# Patient Record
Sex: Male | Born: 1935 | Race: Black or African American | Hispanic: No | Marital: Married | Smoking: Never smoker
Health system: Southern US, Community
[De-identification: ages and names within clinical notes are randomized; demographics above are authoritative.]

## PROBLEM LIST (undated history)

## (undated) DIAGNOSIS — W3400XA Accidental discharge from unspecified firearms or gun, initial encounter: Secondary | ICD-10-CM

## (undated) DIAGNOSIS — M1990 Unspecified osteoarthritis, unspecified site: Secondary | ICD-10-CM

## (undated) DIAGNOSIS — I2510 Atherosclerotic heart disease of native coronary artery without angina pectoris: Secondary | ICD-10-CM

## (undated) DIAGNOSIS — I639 Cerebral infarction, unspecified: Secondary | ICD-10-CM

## (undated) DIAGNOSIS — N400 Enlarged prostate without lower urinary tract symptoms: Secondary | ICD-10-CM

## (undated) DIAGNOSIS — N183 Chronic kidney disease, stage 3 unspecified: Secondary | ICD-10-CM

## (undated) DIAGNOSIS — I509 Heart failure, unspecified: Secondary | ICD-10-CM

## (undated) DIAGNOSIS — D496 Neoplasm of unspecified behavior of brain: Secondary | ICD-10-CM

## (undated) DIAGNOSIS — K56609 Unspecified intestinal obstruction, unspecified as to partial versus complete obstruction: Secondary | ICD-10-CM

## (undated) DIAGNOSIS — I10 Essential (primary) hypertension: Secondary | ICD-10-CM

## (undated) DIAGNOSIS — Z7901 Long term (current) use of anticoagulants: Secondary | ICD-10-CM

## (undated) DIAGNOSIS — A419 Sepsis, unspecified organism: Secondary | ICD-10-CM

## (undated) DIAGNOSIS — I4891 Unspecified atrial fibrillation: Secondary | ICD-10-CM

## (undated) HISTORY — PX: BRAIN TUMOR EXCISION: SHX577

## (undated) HISTORY — DX: Unspecified osteoarthritis, unspecified site: M19.90

---
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
## 1998-07-08 DIAGNOSIS — I2510 Atherosclerotic heart disease of native coronary artery without angina pectoris: Secondary | ICD-10-CM

## 1998-07-08 HISTORY — DX: Atherosclerotic heart disease of native coronary artery without angina pectoris: I25.10

## 1998-07-08 HISTORY — PX: CORONARY ANGIOGRAM: SHX5786

## 2011-07-09 DIAGNOSIS — I509 Heart failure, unspecified: Secondary | ICD-10-CM

## 2011-07-09 HISTORY — DX: Heart failure, unspecified: I50.9

## 2014-06-01 ENCOUNTER — Emergency Department (INDEPENDENT_AMBULATORY_CARE_PROVIDER_SITE_OTHER)
Admission: EM | Admit: 2014-06-01 | Discharge: 2014-06-01 | Disposition: A | Discharge status: Home / Self Care | Payer: Medicare Other | Source: Home / Self Care | Attending: Emergency Medicine | Admitting: Emergency Medicine

## 2014-06-01 ENCOUNTER — Encounter (HOSPITAL_COMMUNITY): Payer: Self-pay | Admitting: Emergency Medicine

## 2014-06-01 DIAGNOSIS — R319 Hematuria, unspecified: Secondary | ICD-10-CM | POA: Diagnosis not present

## 2014-06-01 DIAGNOSIS — Z1211 Encounter for screening for malignant neoplasm of colon: Secondary | ICD-10-CM | POA: Diagnosis not present

## 2014-06-01 DIAGNOSIS — R195 Other fecal abnormalities: Secondary | ICD-10-CM | POA: Diagnosis not present

## 2014-06-01 HISTORY — DX: Neoplasm of unspecified behavior of brain: D49.6

## 2014-06-01 HISTORY — DX: Unspecified atrial fibrillation: I48.91

## 2014-06-01 LAB — POCT I-STAT, CHEM 8
BUN: 38 mg/dL — AB (ref 6–23)
Calcium, Ion: 1.33 mmol/L — ABNORMAL HIGH (ref 1.13–1.30)
Chloride: 107 mEq/L (ref 96–112)
Creatinine, Ser: 1.7 mg/dL — ABNORMAL HIGH (ref 0.50–1.35)
Glucose, Bld: 83 mg/dL (ref 70–99)
HCT: 48 % (ref 39.0–52.0)
HEMOGLOBIN: 16.3 g/dL (ref 13.0–17.0)
Potassium: 4.9 mEq/L (ref 3.7–5.3)
SODIUM: 143 meq/L (ref 137–147)
TCO2: 27 mmol/L (ref 0–100)

## 2014-06-01 LAB — OCCULT BLOOD, POC DEVICE: FECAL OCCULT BLD: POSITIVE — AB

## 2014-06-01 LAB — POCT URINALYSIS DIP (DEVICE)
Bilirubin Urine: NEGATIVE
GLUCOSE, UA: NEGATIVE mg/dL
Ketones, ur: NEGATIVE mg/dL
Leukocytes, UA: NEGATIVE
NITRITE: NEGATIVE
Protein, ur: NEGATIVE mg/dL
SPECIFIC GRAVITY, URINE: 1.025 (ref 1.005–1.030)
Urobilinogen, UA: 0.2 mg/dL (ref 0.0–1.0)
pH: 5.5 (ref 5.0–8.0)

## 2014-06-01 MED ORDER — CIPROFLOXACIN HCL 250 MG PO TABS: 250.0000 mg | ORAL_TABLET | Freq: Two times a day (BID) | ORAL | Status: DC

## 2014-06-01 NOTE — Discharge Instructions (Signed)
Stop Xarelto temporarily until bleeding stops.  Get extra water.   You need an ultrasound of kidney (or CT), a cystoscopy, a PSA, and a colonoscopy (for the blood in the stool)

## 2014-06-01 NOTE — ED Provider Notes (Signed)
Chief Complaint   Hematuria   History of Present Illness   William Ayala is an 80 year old male who has had a history since this morning of bright red blood in his urine with clots. This has been painless. He denies any dysuria, frequency, or urgency. He's had no back pain or flank pain. No abdominal pain, fever, chills, nausea, or vomiting. He denies any blood in his stools. He's had no melena. He states he feels fine and denies any dizziness or weakness. He's had no prior history of blood in his urine or his stool. He denies any history of kidney stones, kidney infections, prostatitis, prostate cancer, or urinary tract infection. He does have chronic kidney disease with a creatinine between 3 and 4.  Review of Systems   Other than as noted above, the patient denies any of the following symptoms: General:  No fever or chills. GI:  No abdominal pain, back pain, nausea, or vomiting. GU: Hematuria, urethral discharge, penile lesions, penile pain, testicular pain, swelling, or mass, or inguinal lymphadenopathy.  [place]   Past medical history, family history, social history, meds, and allergies were reviewed.  He has no medication allergies. He has atrial fibrillation and high blood pressure. He takes atenolol, losartan, triamterene, and Xarelto. His primary care physician is in [place]. He's visiting family for the holidays.  Physical Exam    Vital signs:  BP 145/99 mmHg  Pulse 93  Temp(Src) 97.6 F (36.4 C) (Oral)  Resp 22  SpO2 100% Gen:  Alert, oriented, in no distress. Lungs:  Clear to auscultation, no wheezes, rales or rhonchi. Heart:  Regular rhythm, no gallop or murmer. Abdomen:  Flat and soft.  No tenderness to palpation, guarding, or rebound.  No hepato-splenomegaly or mass.  Bowel sounds were normally active.  No hernia. Rectal exam:  Normal prostate, no nodules, no tenderness, and heme positive stools. Back:  No CVA tenderness.  Skin:  Clear, warm and dry.  Labs    Results for orders placed or performed during the hospital encounter of 06/01/14  POCT urinalysis dip (device)  Result Value Ref Range   Glucose, UA NEGATIVE NEGATIVE mg/dL   Bilirubin Urine NEGATIVE NEGATIVE   Ketones, ur NEGATIVE NEGATIVE mg/dL   Specific Gravity, Urine 1.025 1.005 - 1.030   Hgb urine dipstick LARGE (A) NEGATIVE   pH 5.5 5.0 - 8.0   Protein, ur NEGATIVE NEGATIVE mg/dL   Urobilinogen, UA 0.2 0.0 - 1.0 mg/dL   Nitrite NEGATIVE NEGATIVE   Leukocytes, UA NEGATIVE NEGATIVE  Occult blood, poc device  Result Value Ref Range   Fecal Occult Bld POSITIVE (A) NEGATIVE  I-STAT, chem 8  Result Value Ref Range   Sodium 143 137 - 147 mEq/L   Potassium 4.9 3.7 - 5.3 mEq/L   Chloride 107 96 - 112 mEq/L   BUN 38 (H) 6 - 23 mg/dL   Creatinine, Ser 1.70 (H) 0.50 - 1.35 mg/dL   Glucose, Bld 83 70 - 99 mg/dL   Calcium, Ion 1.33 (H) 1.13 - 1.30 mmol/L   TCO2 27 0 - 100 mmol/L   Hemoglobin 16.3 13.0 - 17.0 g/dL   HCT 48.0 39.0 - 52.0 %    Assessment   The primary encounter diagnosis was Hematuria. A diagnosis of Heme positive stool was also pertinent to this visit.   The differential diagnosis for the hematuria is side effect of Xarelto, urinary tract infection, kidney stones, kidney or bladder cancer, prostate cancer, or prostatitis.  The differential diagnosis for the  heme positive stools is hemorrhoids or fissures, diverticulosis, polyps, colon cancer, or upper GI bleed.  He will need a workup including ultrasound or CT of the kidneys, cystoscopy, PSA, and colonoscopy. He has been referred to urology and GI.  Plan   1.  Meds:  The following meds were prescribed:   New Prescriptions   CIPROFLOXACIN (CIPRO) 250 MG TABLET    Take 1 tablet (250 mg total) by mouth every 12 (twelve) hours.    2.  Patient Education/Counseling:  The patient was given appropriate handouts, self care instructions, and instructed in symptomatic relief.  He was instructed to stop the Xarelto now  until the urine is completely clear. He is to drink extra fluids. Once the urine has cleared he can restart the Xarelto again. He was urged to watch out for increasing bloody through the urethra and the stool which would prompt him to go to the emergency department as soon as possible. I also warned him about side effects of Cipro including tendinitis and told to stop it immediately if this should happen. His son is with him today and understands these precautions. The son also is interested in getting the workup done here in [place] before he goes back to [place], so they're going to see the specialist that I outlined for him to as above.  3.  Follow up:  The patient was told to follow up here if no better in 3 to 4 days, or sooner if becoming worse in any way, and given some red flag symptoms such as fever, persistent vomiting, pain, or difficulty urinating which would prompt immediate return.        Harden Mo, MD 06/01/14 [phone]

## 2014-06-01 NOTE — ED Notes (Signed)
Son with patient.  Reports seeing blood in urine, heavier after urinary stream stopped and described seeing a clot.  Patient denies abdominal pain and denies back pain

## 2014-06-03 LAB — URINE CULTURE
Colony Count: NO GROWTH
Culture: NO GROWTH
Special Requests: NORMAL

## 2014-06-03 NOTE — ED Notes (Signed)
Lab reports negative; no further action required

## 2014-06-06 ENCOUNTER — Telehealth: Payer: Self-pay | Admitting: Internal Medicine

## 2014-06-06 NOTE — Telephone Encounter (Signed)
Son states his father was visiting for Thanksgiving and he was passing some blood in his stool. States his father was seen at urgent care and told to followup with GI. Offered an appt with [facility] but he states he will call back to schedule the appt. Wanted to know how much the visit and possible colon would be. Pt has medicare. Son states he will call back to schedule.

## 2014-06-13 ENCOUNTER — Ambulatory Visit: Payer: Medicare Other | Admitting: Physician Assistant

## 2014-06-14 ENCOUNTER — Encounter: Payer: Self-pay | Admitting: Physician Assistant

## 2014-06-14 ENCOUNTER — Ambulatory Visit (INDEPENDENT_AMBULATORY_CARE_PROVIDER_SITE_OTHER): Payer: Medicare Other | Admitting: Physician Assistant

## 2014-06-14 ENCOUNTER — Other Ambulatory Visit: Payer: Self-pay

## 2014-06-14 ENCOUNTER — Encounter: Payer: Self-pay | Admitting: Internal Medicine

## 2014-06-14 ENCOUNTER — Telehealth: Payer: Self-pay

## 2014-06-14 ENCOUNTER — Other Ambulatory Visit (INDEPENDENT_AMBULATORY_CARE_PROVIDER_SITE_OTHER): Payer: Medicare Other

## 2014-06-14 VITALS — BP 118/66 | HR 90 | Ht 69.0 in | Wt 192.2 lb

## 2014-06-14 DIAGNOSIS — R195 Other fecal abnormalities: Secondary | ICD-10-CM

## 2014-06-14 DIAGNOSIS — Z7901 Long term (current) use of anticoagulants: Secondary | ICD-10-CM | POA: Insufficient documentation

## 2014-06-14 LAB — CBC WITH DIFFERENTIAL/PLATELET
Basophils Absolute: 0 10*3/uL (ref 0.0–0.1)
Basophils Relative: 0.6 % (ref 0.0–3.0)
EOS ABS: 0.2 10*3/uL (ref 0.0–0.7)
EOS PCT: 3 % (ref 0.0–5.0)
HCT: 43.1 % (ref 39.0–52.0)
HEMOGLOBIN: 13.9 g/dL (ref 13.0–17.0)
LYMPHS PCT: 22 % (ref 12.0–46.0)
Lymphs Abs: 1.5 10*3/uL (ref 0.7–4.0)
MCHC: 32.3 g/dL (ref 30.0–36.0)
MCV: 98.5 fl (ref 78.0–100.0)
Monocytes Absolute: 0.7 10*3/uL (ref 0.1–1.0)
Monocytes Relative: 10.6 % (ref 3.0–12.0)
NEUTROS ABS: 4.2 10*3/uL (ref 1.4–7.7)
NEUTROS PCT: 63.8 % (ref 43.0–77.0)
Platelets: 161 10*3/uL (ref 150.0–400.0)
RBC: 4.37 Mil/uL (ref 4.22–5.81)
RDW: 15.5 % (ref 11.5–15.5)
WBC: 6.6 10*3/uL (ref 4.0–10.5)

## 2014-06-14 MED ORDER — PEG-KCL-NACL-NASULF-NA ASC-C 100 G PO SOLR: 1.0000 | Freq: Once | ORAL | Status: DC

## 2014-06-14 NOTE — Progress Notes (Signed)
History and available data reviewed. H&P reviewed. Agree with assessment and plans. Remote colonoscopy with polypectomy (question path). Recently identified with Hemoccult-positive stool. High functional status and interested in colonoscopy. I would proceed with colonoscopy off Xarelto. There is an opening on Tuesday the 15th in the morning.

## 2014-06-14 NOTE — Patient Instructions (Signed)
Your physician has requested that you go to the basement for the following lab work before leaving today: CBC  We will call you with the lab results.

## 2014-06-14 NOTE — Telephone Encounter (Signed)
Pt scheduled for Colon with Dr. Henrene Pastor 06/21/14@8 :30am. Moviprep script sent to pharmacy. Message left for pt to call back.

## 2014-06-14 NOTE — Progress Notes (Signed)
Patient ID: William Ayala, male   DOB: 1927/09/26, 80 y.o.   MRN: [id number]    HPI:    Mr. William Ayala is an 80 year old male referred for evaluation by Dr. Jake Michaelis of the emergency room due to heme-positive stools.  Mr. William Ayala is an 80 year old man who has been healthy aside from removal of a brain tumor in 1991. He was diagnosed with atrial fibrillation approximately 15 or 16 months ago and has been on Xarelto and H2 by his cardiologist, Dr. Marcello Moores in [place]. The patient has been visiting his son in [place] since a few days prior to Thanksgiving. On November 25, the patient began to have bright red blood in his urine with clots. He was seen in the emergency room. He denied any bright red blood per rectum or melena, but was found to have heme-positive stools in the ER as well. The patient says he feels fine and denies any dizziness, weakness, or exertional dyspnea. He's had no prior history of blood in his urine or stool. He was seen by urology yesterday and had a cystoscopy that revealed an enlarged prostate. He was advised to stop his Xarelto when he was in the emergency room. He was told to stay off of it until his urine clear. He was off his Xarelto for 3 days and then restarted it. He did have some hematuria after his cystoscopy yesterday but was advised to stay on his Xarelto. He had a CT of the abdomen and pelvis yesterday which was normal. While in the emergency room, he was advised to follow-up with gastroenterology as well. He had a colonoscopy on 07/15/2001 at which time a polyp was removed from the descending colon. He was advised to have a surveillance colonoscopy in 3 years but did not do so. He has not had any change in his bowel habits or stool caliber. His appetite has been normal and he has had no weight loss.   Past Medical History  Diagnosis Date  . Brain tumor     1991  . Atrial fibrillation   . Arthritis     Past Surgical History  Procedure Laterality Date  . Brain tumor  excision      1991   Family History  Problem Relation Age of Onset  . Heart disease     History  Substance Use Topics  . Smoking status: Never Smoker   . Smokeless tobacco: Never Used  . Alcohol Use: No   Current Outpatient Prescriptions  Medication Sig Dispense Refill  . atenolol (TENORMIN) 25 MG tablet Take by mouth daily.    . Cholecalciferol (VITAMIN D3) 50000 UNITS CAPS Take by mouth.    . losartan (COZAAR) 50 MG tablet Take 50 mg by mouth daily.    . rivaroxaban (XARELTO) 20 MG TABS tablet Take 20 mg by mouth daily with supper.    . triamterene-hydrochlorothiazide (MAXZIDE-25) 37.5-25 MG per tablet Take 1 tablet by mouth daily.     No current facility-administered medications for this visit.   No Known Allergies   Review of Systems: Gen: Denies any fever, chills, sweats, anorexia, fatigue, weakness, malaise, weight loss, and sleep disorder CV: Denies chest pain, angina, palpitations, syncope, orthopnea, PND, peripheral edema, and claudication. Resp: Denies dyspnea at rest, dyspnea with exercise, cough, sputum, wheezing, coughing up blood, and pleurisy. GI: Denies vomiting blood, jaundice, and fecal incontinence.   Denies dysphagia or odynophagia. GU : Denies urinary burning, blood in urine, urinary frequency, urinary hesitancy, nocturnal urination, and urinary incontinence. MS: Denies  joint pain, limitation of movement, and swelling, stiffness, low back pain, extremity pain. Denies muscle weakness, cramps, atrophy.  Derm: Denies rash, itching, dry skin, hives, moles, warts, or unhealing ulcers.  Psych: Denies depression, anxiety, memory loss, suicidal ideation, hallucinations, paranoia, and confusion. Heme: Denies bruising, bleeding, and enlarged lymph nodes. Neuro:  Denies any headaches, dizziness, paresthesias. Endo:  Denies any problems with DM, thyroid, adrenal function  Studies: CT done at [facility] on 06/13/2014 showed no clear explanation for hematuria  identified. There is a possible tiny nonobstructing calculus in the upper pole of the left kidney. No evidence of ureteral calculus or hydronephrosis. Bladder wall thickening, likely related to bladder outlet obstruction from prostatomegaly. No acute findings demonstrated. Portions of upper abdomen are excluded. Atherosclerosis and lumbar spondylosis as described.    Prior Endoscopies:   Colonoscopy 07/15/2001 had an ascending colon polyp removed he was advised to have surveillance in 3 years.  Physical Exam: BP 118/66 mmHg  Pulse 90  Ht 5\' 9"  (1.753 m)  Wt 192 lb 3.2 oz (87.181 kg)  BMI 28.37 kg/m2 Constitutional: Pleasant,well-developed, male in no acute distress. HEENT: Normocephalic and atraumatic. Conjunctivae are normal. No scleral icterus. Neck supple.  Cardiovascular: Normal rate, regular rhythm.  Pulmonary/chest: Effort normal and breath sounds normal. No wheezing, rales or rhonchi. Abdominal: Soft, nondistended, nontender. Bowel sounds active throughout. There are no masses palpable. No hepatomegaly. Rectal: brown stool, heme positive Extremities: no edema Lymphadenopathy: No cervical adenopathy noted. Neurological: Alert and oriented to person place and time. Skin: Skin is warm and dry. No rashes noted. Psychiatric: Normal mood and affect. Behavior is normal.  ASSESSMENT AND PLAN: 80 year old male on Xarelto for A. fib status post a recent episode of hematuria and heme positive stools. Urologic workup has revealed prostatomegaly. The patient continues to have heme positive stools today. He will be sent for a CBC. An extended amount of time has been spent with the patient, his wife, and his son reviewing their options. The patient feels well and is relatively healthy aside from his A. fib. He is interested in proceeding with a colonoscopy to see if he has a recurrent polyp or any other identifiable etiology for his heme positive stools other than his Xarelto. It was explained to  him that if his hemoglobin is normal he may opt to follow up with his primary care provider in [place] and follow hemoglobin. If he is anemic, it may be of further indication for colonoscopy however it was explained to the patient that I need to review this with Dr. Henrene Pastor before proceeding with further invasive testing.The risk of holding anticoagulation therapy or antiplatelet medications was discussed including the increased risk for thromboembolic disease that may include DVT, pulmonary emboli and stroke. The patient understands this risk and is willing to proceed with temporally holding the medication provided that this is approved by her PCP or cardiologist if the decision is made to proceed with colonoscopy.    Jerrie Schussler, Vita Barley PA-C 06/14/2014, 11:27 AM

## 2014-06-14 NOTE — Telephone Encounter (Signed)
-----   Message from Vita Barley Hvozdovic, PA-C sent at 06/14/2014  1:22 PM EST ----- Vaughan Basta, could you check with Sophia--let pt know Dr Henrene Pastor says proceed with colonoscopy one day off xarelto--Dr Henrene Pastor says he has opening the 15th. Movi prep. ----- Message -----    From: Irene Shipper, MD    Sent: 06/14/2014  12:29 PM      To: Vita Barley Hvozdovic, PA-C  I reviewed his chart. Proceed with colonoscopy off Xarelto. I have openings first thing in the morning on Tuesday, December 15.Let the patient know, and please arrange. Thank you ----- Message -----    From: Vita Barley Hvozdovic, PA-C    Sent: 06/14/2014  12:03 PM      To: Irene Shipper, MD  Dr Henrene Pastor, I saw this pt today.I sent his note to you. He is on xarelto for afib. He presented to urgent care with hematuria and heme positive stools. Had cysto yesterday--BPH. He has a hx polyps. ? If you want to rescope to see if recurrent polyp, etc.

## 2014-06-15 NOTE — Telephone Encounter (Signed)
Pts son aware of Colon appt. Letter sent to Dr. Geannie Risen with the [facility] at [facility] for instructions regarding his xerelto. Phone number for Dr. Marcello Moores [phone], fax [phone]. Waiting for reply.

## 2014-06-16 ENCOUNTER — Other Ambulatory Visit: Payer: Self-pay

## 2014-06-16 MED ORDER — PEG-KCL-NACL-NASULF-NA ASC-C 100 G PO SOLR: 1.0000 | Freq: Once | ORAL | Status: DC

## 2014-06-16 NOTE — Telephone Encounter (Signed)
Pt to hold xerelto the day of the pocedure and resume it asap. Prep instructions up front for pt to pick up and script for moviprep sent to pharmacy. Pts son to pick up instructions tomorrow.

## 2014-06-17 ENCOUNTER — Telehealth: Payer: Self-pay | Admitting: Internal Medicine

## 2014-06-17 NOTE — Telephone Encounter (Signed)
Rec'd from [facility] forward 4 pages to Dr.Perry

## 2014-06-20 ENCOUNTER — Encounter: Payer: Self-pay | Admitting: Physician Assistant

## 2014-06-21 ENCOUNTER — Encounter: Payer: Self-pay | Admitting: Internal Medicine

## 2014-06-21 ENCOUNTER — Ambulatory Visit (AMBULATORY_SURGERY_CENTER): Payer: Medicare Other | Admitting: Internal Medicine

## 2014-06-21 VITALS — BP 124/59 | HR 112 | Temp 96.1°F | Resp 21 | Ht 69.0 in | Wt 192.0 lb

## 2014-06-21 DIAGNOSIS — R195 Other fecal abnormalities: Secondary | ICD-10-CM

## 2014-06-21 DIAGNOSIS — D122 Benign neoplasm of ascending colon: Secondary | ICD-10-CM

## 2014-06-21 MED ORDER — SODIUM CHLORIDE 0.9 % IV SOLN: 500.0000 mL | INTRAVENOUS | Status: DC

## 2014-06-21 NOTE — Progress Notes (Signed)
No problems noted in the recovery room. maw 

## 2014-06-21 NOTE — Patient Instructions (Signed)

## 2014-06-21 NOTE — Progress Notes (Signed)
Report to PACU, RN, vss, BBS= Clear.  

## 2014-06-21 NOTE — Op Note (Signed)
[place]  [address]. [place], [id number]   COLONOSCOPY PROCEDURE REPORT  PATIENT: William Ayala, William Ayala  MR#: [id number] BIRTHDATE: [birth year] , 86  yrs. old GENDER: male ENDOSCOPIST: Eustace Quail, MD REFERRED WF:UXNAT Keller, M.D. PROCEDURE DATE:  06/21/2014 PROCEDURE:   Colonoscopy with snare polypectomy x 4 First Screening Colonoscopy - Avg.  risk and is 50 yrs.  old or older - No.  Prior Negative Screening - Now for repeat screening. N/A  History of Adenoma - Now for follow-up colonoscopy & has been > or = to 3 yrs.  N/A  Polyps Removed Today? Yes. ASA CLASS:   Class III INDICATIONS:heme-positive stool.. Colonoscopy 2003 with small polyp removed (pathology uncertain) MEDICATIONS: Monitored anesthesia care and Propofol 150 mg IV  DESCRIPTION OF PROCEDURE:   After the risks benefits and alternatives of the procedure were thoroughly explained, informed consent was obtained.  The digital rectal exam revealed no abnormalities of the rectum.   The LB [id number]  endoscope was introduced through the anus and advanced to the cecum, which was identified by both the appendix and ileocecal valve. No adverse events experienced.   The quality of the prep was excellent, using MoviPrep  The instrument was then slowly withdrawn as the colon was fully examined.  COLON FINDINGS: Four polyps ranging between 3-41mm in size were found in the ascending colon.  A polypectomy was performed with a cold snare.  The resection was complete, the polyp tissue was completely retrieved and sent to histology.   There was mild diverticulosis noted in the sigmoid colon. There was a small nonbleeding right colon AVM.   The examination was otherwise normal.  Retroflexed views revealed internal hemorrhoids. The time to cecum=3 minutes 07 seconds.  Withdrawal time=10 minutes 43 seconds.  The scope was withdrawn and the procedure completed. COMPLICATIONS: There were no immediate  complications.  ENDOSCOPIC IMPRESSION: 1.   Four small polyps were found in the ascending colon; polypectomy was performed with a cold snare 2.   Mild diverticulosis was noted in the sigmoid colon 3.   Incidental small right colon AVM. Otherwise,The examination was otherwise normal  RECOMMENDATIONS: 1.  Resume Xarelto today 2.  Return to the care of your primary provider.  GI follow up as needed  eSigned:  Eustace Quail, MD 06/21/2014 9:11 AM   cc: Philipp Deputy, MD and The Patient

## 2014-06-21 NOTE — Progress Notes (Signed)
Called to room to assist during endoscopic procedure.  Patient ID and intended procedure confirmed with present staff. Received instructions for my participation in the procedure from the performing physician.  

## 2014-06-22 ENCOUNTER — Telehealth: Payer: Self-pay | Admitting: *Deleted

## 2014-06-22 NOTE — Telephone Encounter (Signed)
  Follow up Call-  Call back number 06/21/2014  Post procedure Call Back phone  # [phone]  Permission to leave phone message Yes     Patient questions:  Do you have a fever, pain , or abdominal swelling? No. Pain Score  0 *  Have you tolerated food without any problems? Yes.    Have you been able to return to your normal activities? Yes.    Do you have any questions about your discharge instructions: Diet   No. Medications  No. Follow up visit  No.  Do you have questions or concerns about your Care? No.  Actions: * If pain score is 4 or above: No action needed, pain <4.

## 2014-06-28 ENCOUNTER — Encounter: Payer: Self-pay | Admitting: Internal Medicine

## 2014-07-02 ENCOUNTER — Emergency Department (HOSPITAL_BASED_OUTPATIENT_CLINIC_OR_DEPARTMENT_OTHER): Payer: Medicare Other

## 2014-07-02 ENCOUNTER — Emergency Department (HOSPITAL_BASED_OUTPATIENT_CLINIC_OR_DEPARTMENT_OTHER)
Admission: EM | Admit: 2014-07-02 | Discharge: 2014-07-03 | Disposition: A | Discharge status: Home / Self Care | Payer: Medicare Other | Attending: Emergency Medicine | Admitting: Emergency Medicine

## 2014-07-02 ENCOUNTER — Encounter (HOSPITAL_BASED_OUTPATIENT_CLINIC_OR_DEPARTMENT_OTHER): Payer: Self-pay | Admitting: Emergency Medicine

## 2014-07-02 DIAGNOSIS — M7989 Other specified soft tissue disorders: Secondary | ICD-10-CM | POA: Diagnosis not present

## 2014-07-02 DIAGNOSIS — R0602 Shortness of breath: Secondary | ICD-10-CM

## 2014-07-02 DIAGNOSIS — J189 Pneumonia, unspecified organism: Secondary | ICD-10-CM

## 2014-07-02 DIAGNOSIS — Z79899 Other long term (current) drug therapy: Secondary | ICD-10-CM | POA: Diagnosis not present

## 2014-07-02 DIAGNOSIS — I4891 Unspecified atrial fibrillation: Secondary | ICD-10-CM | POA: Diagnosis not present

## 2014-07-02 DIAGNOSIS — J159 Unspecified bacterial pneumonia: Secondary | ICD-10-CM | POA: Diagnosis not present

## 2014-07-02 DIAGNOSIS — Z86011 Personal history of benign neoplasm of the brain: Secondary | ICD-10-CM | POA: Diagnosis not present

## 2014-07-02 DIAGNOSIS — Z8739 Personal history of other diseases of the musculoskeletal system and connective tissue: Secondary | ICD-10-CM | POA: Diagnosis not present

## 2014-07-02 LAB — COMPREHENSIVE METABOLIC PANEL
ALBUMIN: 3.8 g/dL (ref 3.5–5.2)
ALT: 18 U/L (ref 0–53)
AST: 23 U/L (ref 0–37)
Alkaline Phosphatase: 88 U/L (ref 39–117)
Anion gap: 6 (ref 5–15)
BILIRUBIN TOTAL: 0.4 mg/dL (ref 0.3–1.2)
BUN: 30 mg/dL — ABNORMAL HIGH (ref 6–23)
CO2: 27 mmol/L (ref 19–32)
CREATININE: 1.63 mg/dL — AB (ref 0.50–1.35)
Calcium: 10.2 mg/dL (ref 8.4–10.5)
Chloride: 108 mEq/L (ref 96–112)
GFR calc Af Amer: 43 mL/min — ABNORMAL LOW (ref >90)
GFR calc non Af Amer: 37 mL/min — ABNORMAL LOW (ref >90)
Glucose, Bld: 105 mg/dL — ABNORMAL HIGH (ref 70–99)
Potassium: 4.3 mmol/L (ref 3.5–5.1)
Sodium: 141 mmol/L (ref 135–145)
Total Protein: 7.3 g/dL (ref 6.0–8.3)

## 2014-07-02 LAB — CBC WITH DIFFERENTIAL/PLATELET
BASOS ABS: 0 10*3/uL (ref 0.0–0.1)
Basophils Relative: 0 % (ref 0–1)
EOS PCT: 5 % (ref 0–5)
Eosinophils Absolute: 0.3 10*3/uL (ref 0.0–0.7)
HEMATOCRIT: 42.9 % (ref 39.0–52.0)
Hemoglobin: 13.8 g/dL (ref 13.0–17.0)
LYMPHS ABS: 1.3 10*3/uL (ref 0.7–4.0)
LYMPHS PCT: 24 % (ref 12–46)
MCH: 32.4 pg (ref 26.0–34.0)
MCHC: 32.2 g/dL (ref 30.0–36.0)
MCV: 100.7 fL — ABNORMAL HIGH (ref 78.0–100.0)
MONOS PCT: 13 % — AB (ref 3–12)
Monocytes Absolute: 0.7 10*3/uL (ref 0.1–1.0)
Neutro Abs: 3 10*3/uL (ref 1.7–7.7)
Neutrophils Relative %: 58 % (ref 43–77)
Platelets: 127 10*3/uL — ABNORMAL LOW (ref 150–400)
RBC: 4.26 MIL/uL (ref 4.22–5.81)
RDW: 14.3 % (ref 11.5–15.5)
WBC: 5.2 10*3/uL (ref 4.0–10.5)

## 2014-07-02 LAB — TROPONIN I: Troponin I: <0.03 ng/mL (ref <0.031)

## 2014-07-02 LAB — BRAIN NATRIURETIC PEPTIDE: B NATRIURETIC PEPTIDE 5: 274.3 pg/mL — AB (ref 0.0–100.0)

## 2014-07-02 LAB — PROTIME-INR
INR: 1.22 (ref 0.00–1.49)
Prothrombin Time: 15.4 seconds — ABNORMAL HIGH (ref 11.6–15.2)

## 2014-07-02 MED ORDER — AZITHROMYCIN 250 MG PO TABS: ORAL_TABLET | ORAL | Status: DC

## 2014-07-02 MED ORDER — ALBUTEROL SULFATE HFA 108 (90 BASE) MCG/ACT IN AERS: 2.0000 | INHALATION_SPRAY | RESPIRATORY_TRACT | Status: AC | PRN

## 2014-07-02 MED ORDER — ALBUTEROL SULFATE HFA 108 (90 BASE) MCG/ACT IN AERS: 2.0000 | INHALATION_SPRAY | RESPIRATORY_TRACT | Status: DC | PRN

## 2014-07-02 MED ORDER — ALBUTEROL SULFATE HFA 108 (90 BASE) MCG/ACT IN AERS
INHALATION_SPRAY | RESPIRATORY_TRACT | Status: AC
  Administered 2014-07-02: 2
  Filled 2014-07-02: qty 6.7

## 2014-07-02 NOTE — ED Notes (Signed)
Pt presents to ED with complaints of shortness of breath . PT has history of A fib. Pulse irregular in triage.

## 2014-07-02 NOTE — ED Provider Notes (Signed)
CSN: [id number]     Arrival date & time 07/02/14  2110 History  This chart was scribed for William Speak, MD by Jeanell Sparrow, ED Scribe. This patient was seen in room MH05/MH05 and the patient's care was started at 9:59 PM.   Chief Complaint  Patient presents with  . Shortness of Breath   Patient is a 80 y.o. male presenting with shortness of breath. The history is provided by the patient. No language interpreter was used.  Shortness of Breath Severity:  Moderate Onset quality:  Gradual Duration:  5 days Timing:  Intermittent Progression:  Unchanged Chronicity:  New Relieved by:  None tried Worsened by:  Activity Ineffective treatments:  None tried Associated symptoms: no chest pain, no cough and no fever    HPI Comments: Bernell Sigal is a 80 y.o. male with a hx of Afib and mild Asthma who presents to the Emergency Department complaining of moderate intermittent SOB that started about 4 days ago. He states that the SOB is more present with movement, but is present when he lays down as well. He reports that he has had mild leg swelling. He denies any fever, cough, or chest pain.   Past Medical History  Diagnosis Date  . Brain tumor     1991  . Atrial fibrillation   . Arthritis    Past Surgical History  Procedure Laterality Date  . Brain tumor excision      1991   Family History  Problem Relation Age of Onset  . Heart disease     History  Substance Use Topics  . Smoking status: Never Smoker   . Smokeless tobacco: Never Used  . Alcohol Use: No    Review of Systems  Constitutional: Negative for fever.  Respiratory: Positive for shortness of breath. Negative for cough.   Cardiovascular: Positive for leg swelling. Negative for chest pain.  All other systems reviewed and are negative.   Allergies  Review of patient's allergies indicates no known allergies.  Home Medications   Prior to Admission medications   Medication Sig Start Date End Date Taking? Authorizing  Provider  atenolol (TENORMIN) 25 MG tablet Take by mouth daily.    Historical Provider, MD  Cholecalciferol (VITAMIN D3) 50000 UNITS CAPS Take by mouth.    Historical Provider, MD  losartan (COZAAR) 50 MG tablet Take 50 mg by mouth daily.    Historical Provider, MD  rivaroxaban (XARELTO) 20 MG TABS tablet Take 20 mg by mouth daily with supper.    Historical Provider, MD  triamterene-hydrochlorothiazide (MAXZIDE-25) 37.5-25 MG per tablet Take 1 tablet by mouth daily.    Historical Provider, MD   BP 137/113 mmHg  Pulse 82  Temp(Src) 97.4 F (36.3 C) (Oral)  Resp 20  Ht 5\' 9"  (1.753 m)  Wt 180 lb (81.647 kg)  BMI 26.57 kg/m2  SpO2 100% Physical Exam  Constitutional: He is oriented to person, place, and time. He appears well-developed and well-nourished. No distress.  HENT:  Head: Normocephalic and atraumatic.  Neck: Neck supple. No tracheal deviation present.  Cardiovascular:  Irregularly irregular heartbeat.   Pulmonary/Chest: Effort normal. No respiratory distress. He has rales.  Rales at the bases.  Musculoskeletal: Normal range of motion. He exhibits edema.  +2 pitting edema on LE.   Neurological: He is alert and oriented to person, place, and time.  Skin: Skin is warm and dry.  Psychiatric: He has a normal mood and affect. His behavior is normal.  Nursing note and vitals reviewed.  ED Course  Procedures (including critical care time) DIAGNOSTIC STUDIES: Oxygen Saturation is 100% on RA, normal by my interpretation.    COORDINATION OF CARE: 10:03 PM- Pt advised of plan for treatment which includes radiology and labs and pt agrees.  Labs Review Labs Reviewed  CBC WITH DIFFERENTIAL - Abnormal; Notable for the following:    MCV 100.7 (*)    Platelets 127 (*)    Monocytes Relative 13 (*)    All other components within normal limits  PROTIME-INR - Abnormal; Notable for the following:    Prothrombin Time 15.4 (*)    All other components within normal limits  BASIC  METABOLIC PANEL  TROPONIN I  BRAIN NATRIURETIC PEPTIDE    Imaging Review No results found.   Date: 07/02/2014  Rate: 91  Rhythm: atrial fibrillation  QRS Axis: normal  Intervals: normal  ST/T Wave abnormalities: normal  Conduction Disutrbances:none  Narrative Interpretation:   Old EKG Reviewed: unchanged    MDM   Final diagnoses:  Shortness of breath    Patient presents with complaints of dyspnea for the past several days.  On exam, there is no hypoxia, tachypnea, and no significant edema. His workup reveals an infiltrate in the left lung consistent with pneumonia. He appears well and I believe is appropriate for outpatient treatment. He will be given Zithromax and discharged to home.  I personally performed the services described in this documentation, which was scribed in my presence. The recorded information has been reviewed and is accurate.       William Speak, MD 07/02/14 [phone]

## 2014-07-02 NOTE — Discharge Instructions (Signed)
Zithromax as prescribed.  Return to the emergency department for high fever, severe chest pain, worsening difficulty breathing.   Pneumonia Pneumonia is an infection of the lungs.  CAUSES Pneumonia may be caused by bacteria or a virus. Usually, these infections are caused by breathing infectious particles into the lungs (respiratory tract). SIGNS AND SYMPTOMS   Cough.  Fever.  Chest pain.  Increased rate of breathing.  Wheezing.  Mucus production. DIAGNOSIS  If you have the common symptoms of pneumonia, your health care provider will typically confirm the diagnosis with a chest X-ray. The X-ray will show an abnormality in the lung (pulmonary infiltrate) if you have pneumonia. Other tests of your blood, urine, or sputum may be done to find the specific cause of your pneumonia. Your health care provider may also do tests (blood gases or pulse oximetry) to see how well your lungs are working. TREATMENT  Some forms of pneumonia may be spread to other people when you cough or sneeze. You may be asked to wear a mask before and during your exam. Pneumonia that is caused by bacteria is treated with antibiotic medicine. Pneumonia that is caused by the influenza virus may be treated with an antiviral medicine. Most other viral infections must run their course. These infections will not respond to antibiotics.  HOME CARE INSTRUCTIONS   Cough suppressants may be used if you are losing too much rest. However, coughing protects you by clearing your lungs. You should avoid using cough suppressants if you can.  Your health care provider may have prescribed medicine if he or she thinks your pneumonia is caused by bacteria or influenza. Finish your medicine even if you start to feel better.  Your health care provider may also prescribe an expectorant. This loosens the mucus to be coughed up.  Take medicines only as directed by your health care provider.  Do not smoke. Smoking is a common cause of  bronchitis and can contribute to pneumonia. If you are a smoker and continue to smoke, your cough may last several weeks after your pneumonia has cleared.  A cold steam vaporizer or humidifier in your room or home may help loosen mucus.  Coughing is often worse at night. Sleeping in a semi-upright position in a recliner or using a couple pillows under your head will help with this.  Get rest as you feel it is needed. Your body will usually let you know when you need to rest. PREVENTION A pneumococcal shot (vaccine) is available to prevent a common bacterial cause of pneumonia. This is usually suggested for:  People over 57 years old.  Patients on chemotherapy.  People with chronic lung problems, such as bronchitis or emphysema.  People with immune system problems. If you are over 65 or have a high risk condition, you may receive the pneumococcal vaccine if you have not received it before. In some countries, a routine influenza vaccine is also recommended. This vaccine can help prevent some cases of pneumonia.You may be offered the influenza vaccine as part of your care. If you smoke, it is time to quit. You may receive instructions on how to stop smoking. Your health care provider can provide medicines and counseling to help you quit. SEEK MEDICAL CARE IF: You have a fever. SEEK IMMEDIATE MEDICAL CARE IF:   Your illness becomes worse. This is especially true if you are elderly or weakened from any other disease.  You cannot control your cough with suppressants and are losing sleep.  You begin coughing  up blood.  You develop pain which is getting worse or is uncontrolled with medicines.  Any of the symptoms which initially brought you in for treatment are getting worse rather than better.  You develop shortness of breath or chest pain. MAKE SURE YOU:   Understand these instructions.  Will watch your condition.  Will get help right away if you are not doing well or get  worse. Document Released: 06/24/2005 Document Revised: 11/08/2013 Document Reviewed: 09/13/2010 [facility] Patient Information 2015 [place], [place]. This information is not intended to replace advice given to you by your health care provider. Make sure you discuss any questions you have with your health care provider.

## 2015-01-18 ENCOUNTER — Emergency Department (HOSPITAL_COMMUNITY)
Admission: EM | Admit: 2015-01-18 | Discharge: 2015-01-18 | Disposition: A | Discharge status: Home / Self Care | Payer: Medicare Other | Attending: Emergency Medicine | Admitting: Emergency Medicine

## 2015-01-18 ENCOUNTER — Emergency Department (HOSPITAL_COMMUNITY): Payer: Medicare Other

## 2015-01-18 ENCOUNTER — Encounter (HOSPITAL_COMMUNITY): Payer: Self-pay | Admitting: Emergency Medicine

## 2015-01-18 DIAGNOSIS — Z86011 Personal history of benign neoplasm of the brain: Secondary | ICD-10-CM | POA: Insufficient documentation

## 2015-01-18 DIAGNOSIS — Z7901 Long term (current) use of anticoagulants: Secondary | ICD-10-CM | POA: Diagnosis not present

## 2015-01-18 DIAGNOSIS — R0600 Dyspnea, unspecified: Secondary | ICD-10-CM | POA: Insufficient documentation

## 2015-01-18 DIAGNOSIS — Z79899 Other long term (current) drug therapy: Secondary | ICD-10-CM | POA: Diagnosis not present

## 2015-01-18 DIAGNOSIS — I4891 Unspecified atrial fibrillation: Secondary | ICD-10-CM | POA: Insufficient documentation

## 2015-01-18 DIAGNOSIS — Z8701 Personal history of pneumonia (recurrent): Secondary | ICD-10-CM | POA: Insufficient documentation

## 2015-01-18 DIAGNOSIS — Z8739 Personal history of other diseases of the musculoskeletal system and connective tissue: Secondary | ICD-10-CM | POA: Insufficient documentation

## 2015-01-18 DIAGNOSIS — R5383 Other fatigue: Secondary | ICD-10-CM | POA: Insufficient documentation

## 2015-01-18 DIAGNOSIS — R0602 Shortness of breath: Secondary | ICD-10-CM | POA: Diagnosis present

## 2015-01-18 LAB — BASIC METABOLIC PANEL
Anion gap: 6 (ref 5–15)
BUN: 22 mg/dL — ABNORMAL HIGH (ref 6–20)
CO2: 25 mmol/L (ref 22–32)
Calcium: 9.8 mg/dL (ref 8.9–10.3)
Chloride: 110 mmol/L (ref 101–111)
Creatinine, Ser: 1.91 mg/dL — ABNORMAL HIGH (ref 0.61–1.24)
GFR calc Af Amer: 35 mL/min — ABNORMAL LOW (ref >60)
GFR calc non Af Amer: 30 mL/min — ABNORMAL LOW (ref >60)
Glucose, Bld: 129 mg/dL — ABNORMAL HIGH (ref 65–99)
Potassium: 3.8 mmol/L (ref 3.5–5.1)
Sodium: 141 mmol/L (ref 135–145)

## 2015-01-18 LAB — CBC WITH DIFFERENTIAL/PLATELET
Basophils Absolute: 0 10*3/uL (ref 0.0–0.1)
Basophils Relative: 1 % (ref 0–1)
Eosinophils Absolute: 0.1 10*3/uL (ref 0.0–0.7)
Eosinophils Relative: 2 % (ref 0–5)
HCT: 42.5 % (ref 39.0–52.0)
Hemoglobin: 13.6 g/dL (ref 13.0–17.0)
Lymphocytes Relative: 30 % (ref 12–46)
Lymphs Abs: 1.5 10*3/uL (ref 0.7–4.0)
MCH: 31.4 pg (ref 26.0–34.0)
MCHC: 32 g/dL (ref 30.0–36.0)
MCV: 98.2 fL (ref 78.0–100.0)
Monocytes Absolute: 0.3 10*3/uL (ref 0.1–1.0)
Monocytes Relative: 6 % (ref 3–12)
Neutro Abs: 3.2 10*3/uL (ref 1.7–7.7)
Neutrophils Relative %: 61 % (ref 43–77)
Platelets: 136 10*3/uL — ABNORMAL LOW (ref 150–400)
RBC: 4.33 MIL/uL (ref 4.22–5.81)
RDW: 14.9 % (ref 11.5–15.5)
WBC: 5.1 10*3/uL (ref 4.0–10.5)

## 2015-01-18 LAB — URINALYSIS, ROUTINE W REFLEX MICROSCOPIC
Bilirubin Urine: NEGATIVE
Glucose, UA: NEGATIVE mg/dL
Hgb urine dipstick: NEGATIVE
Ketones, ur: NEGATIVE mg/dL
Leukocytes, UA: NEGATIVE
Nitrite: NEGATIVE
Protein, ur: NEGATIVE mg/dL
Specific Gravity, Urine: 1.014 (ref 1.005–1.030)
Urobilinogen, UA: 1 mg/dL (ref 0.0–1.0)
pH: 6 (ref 5.0–8.0)

## 2015-01-18 LAB — BRAIN NATRIURETIC PEPTIDE: B Natriuretic Peptide: 220 pg/mL — ABNORMAL HIGH (ref 0.0–100.0)

## 2015-01-18 LAB — TROPONIN I: Troponin I: <0.03 ng/mL (ref <0.031)

## 2015-01-18 NOTE — Discharge Instructions (Signed)

## 2015-01-18 NOTE — ED Notes (Signed)
Patient brought back and connected to monitor.  
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
## 2015-01-18 NOTE — ED Notes (Signed)
Patient comes from Home states he has been SOB and Fatigue since Dec. Patient has Hx of PNA and states he has not been at baseline since. Patient denies any pain on arrival A& O x 4 able to move all extermites.

## 2015-10-10 ENCOUNTER — Inpatient Hospital Stay (HOSPITAL_BASED_OUTPATIENT_CLINIC_OR_DEPARTMENT_OTHER)
Admission: EM | Admit: 2015-10-10 | Discharge: 2015-10-15 | DRG: 291 | Disposition: A | Discharge status: Home / Self Care | Payer: Medicare Other | Attending: Internal Medicine | Admitting: Internal Medicine

## 2015-10-10 ENCOUNTER — Encounter (HOSPITAL_BASED_OUTPATIENT_CLINIC_OR_DEPARTMENT_OTHER): Payer: Self-pay

## 2015-10-10 ENCOUNTER — Emergency Department (HOSPITAL_BASED_OUTPATIENT_CLINIC_OR_DEPARTMENT_OTHER): Payer: Medicare Other

## 2015-10-10 DIAGNOSIS — N400 Enlarged prostate without lower urinary tract symptoms: Secondary | ICD-10-CM | POA: Diagnosis present

## 2015-10-10 DIAGNOSIS — I4891 Unspecified atrial fibrillation: Secondary | ICD-10-CM | POA: Diagnosis present

## 2015-10-10 DIAGNOSIS — Z7901 Long term (current) use of anticoagulants: Secondary | ICD-10-CM

## 2015-10-10 DIAGNOSIS — Z7982 Long term (current) use of aspirin: Secondary | ICD-10-CM

## 2015-10-10 DIAGNOSIS — I10 Essential (primary) hypertension: Secondary | ICD-10-CM | POA: Diagnosis present

## 2015-10-10 DIAGNOSIS — I119 Hypertensive heart disease without heart failure: Secondary | ICD-10-CM | POA: Diagnosis present

## 2015-10-10 DIAGNOSIS — R001 Bradycardia, unspecified: Secondary | ICD-10-CM | POA: Diagnosis present

## 2015-10-10 DIAGNOSIS — I2510 Atherosclerotic heart disease of native coronary artery without angina pectoris: Secondary | ICD-10-CM | POA: Diagnosis present

## 2015-10-10 DIAGNOSIS — I509 Heart failure, unspecified: Secondary | ICD-10-CM

## 2015-10-10 DIAGNOSIS — R0902 Hypoxemia: Secondary | ICD-10-CM

## 2015-10-10 DIAGNOSIS — N183 Chronic kidney disease, stage 3 unspecified: Secondary | ICD-10-CM | POA: Diagnosis present

## 2015-10-10 DIAGNOSIS — I130 Hypertensive heart and chronic kidney disease with heart failure and stage 1 through stage 4 chronic kidney disease, or unspecified chronic kidney disease: Secondary | ICD-10-CM | POA: Diagnosis not present

## 2015-10-10 DIAGNOSIS — I5031 Acute diastolic (congestive) heart failure: Secondary | ICD-10-CM | POA: Diagnosis not present

## 2015-10-10 DIAGNOSIS — I5033 Acute on chronic diastolic (congestive) heart failure: Secondary | ICD-10-CM | POA: Diagnosis present

## 2015-10-10 DIAGNOSIS — I272 Other secondary pulmonary hypertension: Secondary | ICD-10-CM | POA: Diagnosis present

## 2015-10-10 DIAGNOSIS — I959 Hypotension, unspecified: Secondary | ICD-10-CM | POA: Diagnosis present

## 2015-10-10 DIAGNOSIS — Z79899 Other long term (current) drug therapy: Secondary | ICD-10-CM

## 2015-10-10 HISTORY — DX: Accidental discharge from unspecified firearms or gun, initial encounter: W34.00XA

## 2015-10-10 HISTORY — DX: Heart failure, unspecified: I50.9

## 2015-10-10 HISTORY — DX: Benign prostatic hyperplasia without lower urinary tract symptoms: N40.0

## 2015-10-10 HISTORY — DX: Chronic kidney disease, stage 3 unspecified: N18.30

## 2015-10-10 HISTORY — DX: Chronic kidney disease, stage 3 (moderate): N18.3

## 2015-10-10 HISTORY — DX: Long term (current) use of anticoagulants: Z79.01

## 2015-10-10 HISTORY — DX: Essential (primary) hypertension: I10

## 2015-10-10 HISTORY — DX: Atherosclerotic heart disease of native coronary artery without angina pectoris: I25.10

## 2015-10-10 LAB — URINALYSIS, ROUTINE W REFLEX MICROSCOPIC
Bilirubin Urine: NEGATIVE
Glucose, UA: NEGATIVE mg/dL
Hgb urine dipstick: NEGATIVE
Ketones, ur: NEGATIVE mg/dL
LEUKOCYTES UA: NEGATIVE
NITRITE: NEGATIVE
PH: 5.5 (ref 5.0–8.0)
Protein, ur: NEGATIVE mg/dL
SPECIFIC GRAVITY, URINE: 1.008 (ref 1.005–1.030)

## 2015-10-10 LAB — CBC WITH DIFFERENTIAL/PLATELET
BASOS ABS: 0 10*3/uL (ref 0.0–0.1)
Basophils Relative: 1 %
EOS ABS: 0.2 10*3/uL (ref 0.0–0.7)
EOS PCT: 4 %
HCT: 40.3 % (ref 39.0–52.0)
Hemoglobin: 12.9 g/dL — ABNORMAL LOW (ref 13.0–17.0)
Lymphocytes Relative: 29 %
Lymphs Abs: 1.5 10*3/uL (ref 0.7–4.0)
MCH: 31.9 pg (ref 26.0–34.0)
MCHC: 32 g/dL (ref 30.0–36.0)
MCV: 99.5 fL (ref 78.0–100.0)
Monocytes Absolute: 0.6 10*3/uL (ref 0.1–1.0)
Monocytes Relative: 11 %
Neutro Abs: 3 10*3/uL (ref 1.7–7.7)
Neutrophils Relative %: 56 %
PLATELETS: 137 10*3/uL — AB (ref 150–400)
RBC: 4.05 MIL/uL — ABNORMAL LOW (ref 4.22–5.81)
RDW: 14.6 % (ref 11.5–15.5)
WBC: 5.3 10*3/uL (ref 4.0–10.5)

## 2015-10-10 LAB — COMPREHENSIVE METABOLIC PANEL
ALT: 40 U/L (ref 17–63)
AST: 42 U/L — AB (ref 15–41)
Albumin: 3.7 g/dL (ref 3.5–5.0)
Alkaline Phosphatase: 94 U/L (ref 38–126)
Anion gap: 6 (ref 5–15)
BUN: 32 mg/dL — ABNORMAL HIGH (ref 6–20)
CHLORIDE: 111 mmol/L (ref 101–111)
CO2: 24 mmol/L (ref 22–32)
CREATININE: 1.9 mg/dL — AB (ref 0.61–1.24)
Calcium: 10 mg/dL (ref 8.9–10.3)
GFR calc non Af Amer: 30 mL/min — ABNORMAL LOW (ref >60)
GFR, EST AFRICAN AMERICAN: 35 mL/min — AB (ref >60)
Glucose, Bld: 104 mg/dL — ABNORMAL HIGH (ref 65–99)
POTASSIUM: 3.9 mmol/L (ref 3.5–5.1)
SODIUM: 141 mmol/L (ref 135–145)
Total Bilirubin: 0.6 mg/dL (ref 0.3–1.2)
Total Protein: 7 g/dL (ref 6.5–8.1)

## 2015-10-10 LAB — BRAIN NATRIURETIC PEPTIDE: B Natriuretic Peptide: 594.8 pg/mL — ABNORMAL HIGH (ref 0.0–100.0)

## 2015-10-10 LAB — TROPONIN I: Troponin I: <0.03 ng/mL (ref <0.031)

## 2015-10-10 LAB — MAGNESIUM: Magnesium: 1.9 mg/dL (ref 1.7–2.4)

## 2015-10-10 LAB — PHOSPHORUS: Phosphorus: 3.1 mg/dL (ref 2.5–4.6)

## 2015-10-10 MED ORDER — OXYBUTYNIN CHLORIDE ER 10 MG PO TB24
10.0000 mg | ORAL_TABLET | Freq: Every day | ORAL | Status: DC
  Administered 2015-10-11 – 2015-10-14 (×5): 10 mg via ORAL
  Filled 2015-10-10 (×5): qty 1

## 2015-10-10 MED ORDER — FINASTERIDE 5 MG PO TABS
5.0000 mg | ORAL_TABLET | Freq: Every day | ORAL | Status: DC
  Administered 2015-10-11 – 2015-10-15 (×5): 5 mg via ORAL
  Filled 2015-10-10 (×5): qty 1

## 2015-10-10 MED ORDER — TRIAMTERENE-HCTZ 37.5-25 MG PO TABS
1.0000 | ORAL_TABLET | Freq: Every day | ORAL | Status: DC
  Filled 2015-10-10: qty 1

## 2015-10-10 MED ORDER — ATENOLOL 25 MG PO TABS
25.0000 mg | ORAL_TABLET | Freq: Every day | ORAL | Status: DC
  Administered 2015-10-11 – 2015-10-12 (×2): 25 mg via ORAL
  Filled 2015-10-10 (×2): qty 1

## 2015-10-10 MED ORDER — RIVAROXABAN 15 MG PO TABS
15.0000 mg | ORAL_TABLET | Freq: Every day | ORAL | Status: DC
  Administered 2015-10-11 – 2015-10-14 (×4): 15 mg via ORAL
  Filled 2015-10-10 (×4): qty 1

## 2015-10-10 MED ORDER — ONDANSETRON HCL 4 MG/2ML IJ SOLN: 4.0000 mg | Freq: Four times a day (QID) | INTRAMUSCULAR | Status: DC | PRN

## 2015-10-10 MED ORDER — ACETAMINOPHEN 325 MG PO TABS: 650.0000 mg | ORAL_TABLET | ORAL | Status: DC | PRN

## 2015-10-10 MED ORDER — SODIUM CHLORIDE 0.9 % IV SOLN
250.0000 mL | INTRAVENOUS | Status: DC | PRN
Start: 2015-10-10 — End: 2015-10-15

## 2015-10-10 MED ORDER — SODIUM CHLORIDE 0.9% FLUSH: 3.0000 mL | INTRAVENOUS | Status: DC | PRN

## 2015-10-10 MED ORDER — FUROSEMIDE 10 MG/ML IJ SOLN
20.0000 mg | Freq: Once | INTRAMUSCULAR | Status: AC
  Administered 2015-10-10: 20 mg via INTRAVENOUS
  Filled 2015-10-10: qty 2

## 2015-10-10 MED ORDER — ASPIRIN 81 MG PO CHEW
81.0000 mg | CHEWABLE_TABLET | Freq: Every day | ORAL | Status: DC
  Administered 2015-10-11 – 2015-10-12 (×2): 81 mg via ORAL
  Filled 2015-10-10 (×3): qty 1

## 2015-10-10 MED ORDER — RIVAROXABAN 20 MG PO TABS: 20.0000 mg | ORAL_TABLET | Freq: Every day | ORAL | Status: DC

## 2015-10-10 MED ORDER — ALBUTEROL SULFATE (2.5 MG/3ML) 0.083% IN NEBU: 2.5000 mg | INHALATION_SOLUTION | RESPIRATORY_TRACT | Status: DC | PRN

## 2015-10-10 MED ORDER — ATENOLOL 25 MG PO TABS
50.0000 mg | ORAL_TABLET | Freq: Once | ORAL | Status: AC
  Administered 2015-10-10: 50 mg via ORAL
  Filled 2015-10-10: qty 2

## 2015-10-10 MED ORDER — ISOSORBIDE MONONITRATE ER 30 MG PO TB24
30.0000 mg | ORAL_TABLET | Freq: Every day | ORAL | Status: DC
  Administered 2015-10-11 – 2015-10-12 (×2): 30 mg via ORAL
  Filled 2015-10-10 (×3): qty 1

## 2015-10-10 MED ORDER — SODIUM CHLORIDE 0.9% FLUSH
3.0000 mL | Freq: Two times a day (BID) | INTRAVENOUS | Status: DC
  Administered 2015-10-11 – 2015-10-15 (×9): 3 mL via INTRAVENOUS

## 2015-10-10 MED ORDER — LOSARTAN POTASSIUM 50 MG PO TABS
50.0000 mg | ORAL_TABLET | Freq: Every day | ORAL | Status: DC
  Administered 2015-10-11 – 2015-10-12 (×2): 50 mg via ORAL
  Filled 2015-10-10 (×3): qty 1
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
## 2015-10-10 NOTE — ED Notes (Signed)
MD at bedside. 
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
## 2015-10-10 NOTE — ED Notes (Addendum)
C/o SOB x 3 days-swelling to both ankles x 2 days-pt NAD with steady gait-son reports pt with increase SOB with ambulation

## 2015-10-10 NOTE — ED Notes (Signed)
Walked patient in hallway with Pulse Ox, Patient O2 started at 93% made it to the other side of doctors area and O2 was reading 71 % pulse 88. Patient states he feels fine, walked patient back to room 1, O2 remains at 74%, Placed patient back on monitor and O2 reads 88% after a few minutes, Butch Penny RN notified and Dr. Oleta Mouse made aware.

## 2015-10-10 NOTE — Plan of Care (Signed)
Called by care link from [facility]  Referring MD: Dr Brantley Stage Patient William Ayala PCP and cardiologist in [place]  82 year old male with Afib on Xarelto presenting to ED dyspnea. States that he has had progressively worsening shortness of breath and dyspnea on exertion over the past 3 days, much worse today, winded, dizziness. Hypoxic 70% on ambulation in ED, currently in Afib intermittently in RVR On BB and xarelto  BNP 594, trop <0.03  - Accepted Observation, tele, will need echo, lasix     Genie Wenke M.D. [facility] 10/10/2015, 5:36 PM  Pager: [phone]

## 2015-10-10 NOTE — ED Notes (Signed)
Given something to eat and drink
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
## 2015-10-11 ENCOUNTER — Encounter (HOSPITAL_COMMUNITY): Payer: Self-pay | Admitting: General Practice

## 2015-10-11 ENCOUNTER — Observation Stay (HOSPITAL_BASED_OUTPATIENT_CLINIC_OR_DEPARTMENT_OTHER): Payer: Medicare Other

## 2015-10-11 ENCOUNTER — Other Ambulatory Visit (HOSPITAL_COMMUNITY): Payer: Medicare Other

## 2015-10-11 DIAGNOSIS — Z7901 Long term (current) use of anticoagulants: Secondary | ICD-10-CM | POA: Diagnosis not present

## 2015-10-11 DIAGNOSIS — N183 Chronic kidney disease, stage 3 unspecified: Secondary | ICD-10-CM | POA: Diagnosis present

## 2015-10-11 DIAGNOSIS — I959 Hypotension, unspecified: Secondary | ICD-10-CM | POA: Diagnosis present

## 2015-10-11 DIAGNOSIS — I119 Hypertensive heart disease without heart failure: Secondary | ICD-10-CM | POA: Diagnosis not present

## 2015-10-11 DIAGNOSIS — I509 Heart failure, unspecified: Secondary | ICD-10-CM

## 2015-10-11 DIAGNOSIS — I4891 Unspecified atrial fibrillation: Secondary | ICD-10-CM | POA: Diagnosis present

## 2015-10-11 DIAGNOSIS — I10 Essential (primary) hypertension: Secondary | ICD-10-CM | POA: Diagnosis not present

## 2015-10-11 DIAGNOSIS — I5031 Acute diastolic (congestive) heart failure: Secondary | ICD-10-CM | POA: Diagnosis not present

## 2015-10-11 DIAGNOSIS — R001 Bradycardia, unspecified: Secondary | ICD-10-CM | POA: Diagnosis present

## 2015-10-11 DIAGNOSIS — I130 Hypertensive heart and chronic kidney disease with heart failure and stage 1 through stage 4 chronic kidney disease, or unspecified chronic kidney disease: Secondary | ICD-10-CM | POA: Diagnosis present

## 2015-10-11 DIAGNOSIS — Z79899 Other long term (current) drug therapy: Secondary | ICD-10-CM | POA: Diagnosis not present

## 2015-10-11 DIAGNOSIS — I5033 Acute on chronic diastolic (congestive) heart failure: Secondary | ICD-10-CM | POA: Diagnosis present

## 2015-10-11 DIAGNOSIS — I2510 Atherosclerotic heart disease of native coronary artery without angina pectoris: Secondary | ICD-10-CM | POA: Diagnosis not present

## 2015-10-11 DIAGNOSIS — R0902 Hypoxemia: Secondary | ICD-10-CM | POA: Diagnosis present

## 2015-10-11 DIAGNOSIS — Z7982 Long term (current) use of aspirin: Secondary | ICD-10-CM | POA: Diagnosis not present

## 2015-10-11 DIAGNOSIS — I272 Other secondary pulmonary hypertension: Secondary | ICD-10-CM | POA: Diagnosis present

## 2015-10-11 DIAGNOSIS — N400 Enlarged prostate without lower urinary tract symptoms: Secondary | ICD-10-CM | POA: Diagnosis present

## 2015-10-11 LAB — ECHOCARDIOGRAM COMPLETE
HEIGHTINCHES: 69 in
WEIGHTICAEL: 2809.6 [oz_av]

## 2015-10-11 LAB — BASIC METABOLIC PANEL
Anion gap: 13 (ref 5–15)
BUN: 29 mg/dL — ABNORMAL HIGH (ref 6–20)
CO2: 21 mmol/L — ABNORMAL LOW (ref 22–32)
Calcium: 10.5 mg/dL — ABNORMAL HIGH (ref 8.9–10.3)
Chloride: 108 mmol/L (ref 101–111)
Creatinine, Ser: 1.89 mg/dL — ABNORMAL HIGH (ref 0.61–1.24)
GFR, EST AFRICAN AMERICAN: 35 mL/min — AB (ref >60)
GFR, EST NON AFRICAN AMERICAN: 31 mL/min — AB (ref >60)
GLUCOSE: 93 mg/dL (ref 65–99)
POTASSIUM: 4.1 mmol/L (ref 3.5–5.1)
SODIUM: 142 mmol/L (ref 135–145)

## 2015-10-11 MED ORDER — FUROSEMIDE 10 MG/ML IJ SOLN
40.0000 mg | Freq: Every day | INTRAMUSCULAR | Status: DC
  Administered 2015-10-11 – 2015-10-12 (×2): 40 mg via INTRAVENOUS
  Filled 2015-10-11 (×2): qty 4
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
## 2015-10-11 NOTE — Care Management Obs Status (Signed)
[place] NOTIFICATION   Patient Details  Name: Righteous Chiasson MRN: [id number] Date of Birth: [birth year]   Medicare Observation Status Notification Given:  Yes    Royston Bake, RN 10/11/2015, 2:45 PM

## 2015-10-11 NOTE — Progress Notes (Signed)
  Echocardiogram 2D Echocardiogram has been performed.  Jennette Dubin 10/11/2015, 1:50 PM
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
## 2015-10-12 DIAGNOSIS — I2510 Atherosclerotic heart disease of native coronary artery without angina pectoris: Secondary | ICD-10-CM

## 2015-10-12 LAB — BASIC METABOLIC PANEL
Anion gap: 12 (ref 5–15)
BUN: 38 mg/dL — ABNORMAL HIGH (ref 6–20)
CHLORIDE: 105 mmol/L (ref 101–111)
CO2: 25 mmol/L (ref 22–32)
CREATININE: 1.89 mg/dL — AB (ref 0.61–1.24)
Calcium: 10.5 mg/dL — ABNORMAL HIGH (ref 8.9–10.3)
GFR calc non Af Amer: 31 mL/min — ABNORMAL LOW (ref >60)
GFR, EST AFRICAN AMERICAN: 35 mL/min — AB (ref >60)
Glucose, Bld: 114 mg/dL — ABNORMAL HIGH (ref 65–99)
POTASSIUM: 3.9 mmol/L (ref 3.5–5.1)
Sodium: 142 mmol/L (ref 135–145)

## 2015-10-12 MED ORDER — FUROSEMIDE 40 MG PO TABS
40.0000 mg | ORAL_TABLET | Freq: Every day | ORAL | Status: DC
  Filled 2015-10-12: qty 1

## 2015-10-12 MED ORDER — LACTULOSE 10 GM/15ML PO SOLN
30.0000 g | Freq: Every day | ORAL | Status: DC | PRN
  Administered 2015-10-12: 30 g via ORAL
  Filled 2015-10-12 (×2): qty 45

## 2015-10-12 MED ORDER — ATENOLOL 25 MG PO TABS
25.0000 mg | ORAL_TABLET | Freq: Two times a day (BID) | ORAL | Status: DC
  Administered 2015-10-12: 25 mg via ORAL
  Filled 2015-10-12 (×2): qty 1
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
## 2015-10-13 DIAGNOSIS — I119 Hypertensive heart disease without heart failure: Secondary | ICD-10-CM

## 2015-10-13 LAB — BASIC METABOLIC PANEL
Anion gap: 14 (ref 5–15)
BUN: 42 mg/dL — ABNORMAL HIGH (ref 6–20)
CHLORIDE: 101 mmol/L (ref 101–111)
CO2: 25 mmol/L (ref 22–32)
CREATININE: 2.14 mg/dL — AB (ref 0.61–1.24)
Calcium: 10.8 mg/dL — ABNORMAL HIGH (ref 8.9–10.3)
GFR calc non Af Amer: 26 mL/min — ABNORMAL LOW (ref >60)
GFR, EST AFRICAN AMERICAN: 30 mL/min — AB (ref >60)
Glucose, Bld: 98 mg/dL (ref 65–99)
Potassium: 4.2 mmol/L (ref 3.5–5.1)
Sodium: 140 mmol/L (ref 135–145)

## 2015-10-13 LAB — TSH: TSH: 0.842 u[IU]/mL (ref 0.350–4.500)

## 2015-10-13 MED ORDER — POLYVINYL ALCOHOL 1.4 % OP SOLN
1.0000 [drp] | OPHTHALMIC | Status: DC | PRN
  Administered 2015-10-13: 1 [drp] via OPHTHALMIC
  Filled 2015-10-13: qty 15

## 2015-10-13 MED ORDER — ATENOLOL 25 MG PO TABS: 25.0000 mg | ORAL_TABLET | Freq: Every evening | ORAL | Status: DC

## 2015-10-13 MED ORDER — ATENOLOL 50 MG PO TABS
50.0000 mg | ORAL_TABLET | Freq: Every morning | ORAL | Status: DC
  Administered 2015-10-13 – 2015-10-15 (×3): 50 mg via ORAL
  Filled 2015-10-13 (×3): qty 1

## 2015-10-13 NOTE — Care Management Important Message (Signed)
Important Message  Patient Details  Name: William Ayala MRN: [id number] Date of Birth: 1928-02-25   Medicare Important Message Given:  Yes    [name] 10/13/2015, 10:56 AM

## 2015-10-13 NOTE — Progress Notes (Signed)
Pt is on Atenolol 50 mg 1x day at home, thanks [place]ner [place]

## 2015-10-13 NOTE — Progress Notes (Addendum)
Pt continues to have HR sustain in 120-130's at times, especially when he ambulates, and has SOB when ambulates, MD notified and aware, will continue to [place]

## 2015-10-13 NOTE — Progress Notes (Signed)
BP 95/51; HR 149.  Notified cardiology, stating to hold Lasix, atenolol, and cozaar until receive further orders.

## 2015-10-13 NOTE — Progress Notes (Signed)
[facility] PROGRESS NOTE  William Ayala [id number] DOB: 1927-08-14 DOA: 10/10/2015 PCP: Leandro Reasoner, MD  Assessment/Plan: Acute on chronic diastolic heart failure: Likely precipitated by A fib RVR. Compensated with IV Lasix. Euvolemic without lower extremity edema. Weight decreased to 167 pounds (180 pounds on admission). Echocardiogram showed EF around 45-50%. Hold diuretics due to bump in creatinine.  Atrial fibrillation with RVR- 2-3 year history of A fib, CHADs2VASc: 5- on Xarelto, rate still not appropriate-cardiology adjusting medications-Atenolol dosage being increased. Follow.    Essential hypertension- Had continued soft BPs, but became hypotensive today (BP 95/51, HR 149)- hold Cozaar, Imdur, Lasix today- cardiology adjusting medications. Follow.   Acute on Chronic kidney disease, stage 3- BUN/Cr elevated today likely 2/2 to diuretcs, will hold Lasix today.Follow  Coronary artery disease- Denies any recent chest pain. Cath in 2009, but no confirmed CAD. Will hold ASA per cardiology as patient is on Xarelto, continue imdur.   BPH- continue Proscar.  Code Status: Full Family Communication: Sons at bedside Disposition Plan: Remain inpatient   Consultants:  Cardiology  Procedures:  None  Antibiotics:  None   HPI/Subjective: No shortness of breath or leg swelling, heart rates continue to fluctuate and be on the high side.  Objective: Filed Vitals:   10/13/15 0903 10/13/15 0953  BP: 116/54 95/51  Pulse: 113 149  Temp: 95.9 F (35.5 C)   Resp: 12     Intake/Output Summary (Last 24 hours) at 10/13/15 1104 Last data filed at 10/13/15 1038  Gross per 24 hour  Intake    483 ml  Output      1 ml  Net    482 ml   Filed Weights   10/11/15 0509 10/12/15 0551 10/13/15 0607  Weight: 79.652 kg (175 lb 9.6 oz) 78.881 kg (173 lb 14.4 oz) 76 kg (167 lb 8.8 oz)    Exam:   General:  Awake, alert, no apparent distress. Cooperative, answering questions  appropriately.  Cardiovascular: Irregularly irregular  Respiratory: CTAB  Abdomen: soft, nontender, nondistended, +BS  Extremities:  No edema, lower extremities warm to touch  Data Reviewed: Basic Metabolic Panel:  Recent Labs Lab 10/10/15 1535 10/11/15 0307 10/12/15 0531 10/13/15 0411  NA 141 142 142 140  K 3.9 4.1 3.9 4.2  CL 111 108 105 101  CO2 24 21* 25 25  GLUCOSE 104* 93 114* 98  BUN 32* 29* 38* 42*  CREATININE 1.90* 1.89* 1.89* 2.14*  CALCIUM 10.0 10.5* 10.5* 10.8*  MG 1.9  --   --   --   PHOS 3.1  --   --   --    Liver Function Tests:  Recent Labs Lab 10/10/15 1535  AST 42*  ALT 40  ALKPHOS 94  BILITOT 0.6  PROT 7.0  ALBUMIN 3.7   No results for input(s): LIPASE, AMYLASE in the last 168 hours. No results for input(s): AMMONIA in the last 168 hours. CBC:  Recent Labs Lab 10/10/15 1535  WBC 5.3  NEUTROABS 3.0  HGB 12.9*  HCT 40.3  MCV 99.5  PLT 137*   Cardiac Enzymes:  Recent Labs Lab 10/10/15 1535  TROPONINI <0.03   BNP (last 3 results)  Recent Labs  01/18/15 1148 10/10/15 1535  BNP 220.0* 594.8*    ProBNP (last 3 results) No results for input(s): PROBNP in the last 8760 hours.  CBG: No results for input(s): GLUCAP in the last 168 hours.  No results found for this or any previous visit (from the past 240 hour(s)).  Studies: No results found.  Scheduled Meds: . aspirin  81 mg Oral Daily  . atenolol  25 mg Oral BID  . finasteride  5 mg Oral Daily  . furosemide  40 mg Oral Daily  . isosorbide mononitrate  30 mg Oral Daily  . losartan  50 mg Oral Daily  . oxybutynin  10 mg Oral QHS  . rivaroxaban  15 mg Oral Q supper  . sodium chloride flush  3 mL Intravenous Q12H   Continuous Infusions:   Principal Problem:   Acute diastolic CHF (congestive heart failure) (HCC) Active Problems:   Chronic anticoagulation   Atrial fibrillation with RVR (HCC)   CHF exacerbation (HCC)   Essential hypertension   Hypertensive heart  disease   Chronic renal disease, stage IIIB   CAD by prior cath in 2000 [facility])- medical Rx  Time spent: 25 mins  Kara Dies PA-S [facility] If 7PM-7AM, please contact night-coverage at [email or web address], password [facility] 10/13/2015, 11:04 AM  LOS: 2 days     Attending MD note  Patient was seen, examined,treatment plan was discussed with the PA-S.  I have personally reviewed the clinical findings, lab, imaging studies and management of this patient in detail. I agree with the documentation, as recorded by thePA-S.   Continues to have episodes of A. fib RVR-atenolol dosage has been increased. Blood pressure soft, creatinine has increased-plan is to hold diuretics, losartan and Imdur today. Recheck electrolytes tomorrow, hopefully if rates are better-should be able to discharged home.  On exam-lungs are clear with no leg edema  Rest as above  [facility]

## 2015-10-13 NOTE — Progress Notes (Signed)
Patient Name: William Ayala Date of Encounter: 10/13/2015  Principal Problem:   Acute diastolic CHF (congestive heart failure) (William Ayala) Active Problems:   Chronic anticoagulation   Atrial fibrillation with RVR ([place])   CHF exacerbation (HCC)   Essential hypertension   Hypertensive heart disease   Chronic renal disease, stage IIIB   CAD by prior cath in 2000 [facility])- medical Rx   Primary Cardiologist: Dr William Ayala [facility] Dr William Ayala in [place]  Patient Profile: 82 yo male w/ hx afib on Xarelto and atenolol w/ pauses, EF 57% 2013, CAD w/ cath 2000 rx medically, ?dementia, admitted 04/05 w/ DOE.  SUBJECTIVE: Feels well, not light-headed or weak. Has not been OOB much.   OBJECTIVE Filed Vitals:   10/12/15 2114 10/13/15 0607 10/13/15 0903 10/13/15 0953  BP: 110/78 128/91 116/54 95/51  Pulse: 78  113 149  Temp:  97.3 F (36.3 C) 95.9 F (35.5 C)   TempSrc: Oral Axillary Axillary   Resp: 16 19 12    Height:      Weight:  167 lb 8.8 oz (76 kg)    SpO2: 100% 94% 100%     Intake/Output Summary (Last 24 hours) at 10/13/15 1035 Last data filed at 10/13/15 0850  Gross per 24 hour  Intake    480 ml  Output      1 ml  Net    479 ml   Filed Weights   10/11/15 0509 10/12/15 0551 10/13/15 0607  Weight: 175 lb 9.6 oz (79.652 kg) 173 lb 14.4 oz (78.881 kg) 167 lb 8.8 oz (76 kg)    PHYSICAL EXAM General: Well developed, well nourished, male in no acute distress. Head: Normocephalic, atraumatic.  Neck: Supple without bruits, JVD not elevated. Lungs:  Resp regular and unlabored, decreased BS bases. Heart: Irreg irreg, S1, S2, no S3, S4, or murmur; no rub. Abdomen: Soft, non-tender, non-distended, BS + x 4.  Extremities: No clubbing, cyanosis, edema.  Neuro: Alert and oriented X 3. Moves all extremities spontaneously. Psych: Normal affect.  LABS: CBC: Recent Labs  10/10/15 1535  WBC 5.3  NEUTROABS 3.0  HGB 12.9*  HCT 40.3  MCV 99.5  PLT 0000000*   Basic Metabolic Panel: Recent  Labs  10/10/15 1535  10/12/15 0531 10/13/15 0411  NA 141  < > 142 140  K 3.9  < > 3.9 4.2  CL 111  < > 105 101  CO2 24  < > 25 25  GLUCOSE 104*  < > 114* 98  BUN 32*  < > 38* 42*  CREATININE 1.90*  < > 1.89* 2.14*  CALCIUM 10.0  < > 10.5* 10.8*  MG 1.9  --   --   --   PHOS 3.1  --   --   --   < > = values in this interval not displayed. Liver Function Tests: Recent Labs  10/10/15 1535  AST 42*  ALT 40  ALKPHOS 94  BILITOT 0.6  PROT 7.0  ALBUMIN 3.7   Cardiac Enzymes: Recent Labs  10/10/15 1535  TROPONINI <0.03   BNP:  B NATRIURETIC PEPTIDE  Date/Time Value Ref Range Status  10/10/2015 03:35 PM 594.8* 0.0 - 100.0 pg/mL Final  01/18/2015 11:48 AM 220.0* 0.0 - 100.0 pg/mL Final    TELE:  Atrial fib, mostly > 100      Current Medications:  . aspirin  81 mg Oral Daily  . atenolol  25 mg Oral BID  . finasteride  5 mg Oral Daily  .  furosemide  40 mg Oral Daily  . isosorbide mononitrate  30 mg Oral Daily  . losartan  50 mg Oral Daily  . oxybutynin  10 mg Oral QHS  . rivaroxaban  15 mg Oral Q supper  . sodium chloride flush  3 mL Intravenous Q12H      ASSESSMENT AND PLAN: Principal Problem: 1.  Acute diastolic CHF (congestive heart failure) (HCC) - hold Lasix today since BUN/Cr up and BP low.  - possibly restart in am at 20 mg qd - continue to follow BMET  2. Atrial fibrillation with RVR ([place]) - give atenolol once SBP > 100 - needs more BB in general, will increase standing dose to 50 mg am, 25 mg pm - may have to give 25 mg tid today as BP will tolerate  3.  Chronic anticoagulation - per family, pt taken off ASA when put on Xarelto - CHADS2VASC=4 - no confirmed CAD, hold ASA today, may be able to d/c it.  Active Problems:   CHF exacerbation (HCC)   Essential hypertension   Hypertensive heart disease   Chronic renal disease, stage IIIB   CAD by prior cath in 2000 [facility])- medical Rx   Signed, William Ayala 10:35 AM 10/13/2015 Patient  seen and examined and history reviewed. Agree with above findings and plan. Patient feels well. HR still not well controlled on atenolol 50 mg daily. This was his prior home dose. Weight is down. No edema. meds held this am due to low BP. Recommend increasing atenolol to 75 mg daily. Will hold Imdur, losartan and lasix today. Plan to resume lasix and losartan in am. Would reduce lasix to 20 mg daily. He should not take ASA since on Xarelto.  William Ayala, [place] 10/13/2015 11:08 AM

## 2015-10-13 NOTE — Discharge Instructions (Addendum)

## 2015-10-14 LAB — BASIC METABOLIC PANEL
ANION GAP: 12 (ref 5–15)
BUN: 40 mg/dL — AB (ref 6–20)
CALCIUM: 10.2 mg/dL (ref 8.9–10.3)
CO2: 25 mmol/L (ref 22–32)
Chloride: 102 mmol/L (ref 101–111)
Creatinine, Ser: 1.95 mg/dL — ABNORMAL HIGH (ref 0.61–1.24)
GFR calc Af Amer: 34 mL/min — ABNORMAL LOW (ref >60)
GFR calc non Af Amer: 29 mL/min — ABNORMAL LOW (ref >60)
GLUCOSE: 108 mg/dL — AB (ref 65–99)
Potassium: 3.7 mmol/L (ref 3.5–5.1)
Sodium: 139 mmol/L (ref 135–145)

## 2015-10-14 MED ORDER — FUROSEMIDE 40 MG PO TABS
40.0000 mg | ORAL_TABLET | Freq: Every day | ORAL | Status: DC
  Administered 2015-10-14 – 2015-10-15 (×2): 40 mg via ORAL
  Filled 2015-10-14 (×2): qty 1

## 2015-10-14 MED ORDER — DIGOXIN 0.25 MG/ML IJ SOLN
0.2500 mg | Freq: Four times a day (QID) | INTRAMUSCULAR | Status: AC
  Administered 2015-10-14 (×2): 0.25 mg via INTRAVENOUS
  Filled 2015-10-14 (×2): qty 2

## 2015-10-14 MED ORDER — POTASSIUM CHLORIDE CRYS ER 20 MEQ PO TBCR
20.0000 meq | EXTENDED_RELEASE_TABLET | Freq: Every day | ORAL | Status: DC
  Administered 2015-10-14 – 2015-10-15 (×2): 20 meq via ORAL
  Filled 2015-10-14 (×2): qty 1

## 2015-10-14 MED ORDER — ATENOLOL 50 MG PO TABS
50.0000 mg | ORAL_TABLET | Freq: Every evening | ORAL | Status: DC
  Administered 2015-10-14: 50 mg via ORAL
  Filled 2015-10-14 (×2): qty 1
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
## 2015-10-14 NOTE — Progress Notes (Signed)
Subjective:  No complaints of chest pain or shortness of breath.  Objective:  Vital Signs in the last 24 hours: BP 120/94 mmHg  Pulse 74  Temp(Src) 98.2 F (36.8 C) (Oral)  Resp 18  Ht 5\' 9"  (1.753 m)  Wt 77.248 kg (170 lb 4.8 oz)  BMI 25.14 kg/m2  SpO2 99%  Physical Exam: Pleasant elderly black male in no acute distress Lungs:  Clear  Cardiac:  Rapid irregular rhythm, normal S1 and S2, no S3 Abdomen:  Soft, nontender, no masses Extremities:  No edema present  Intake/Output from previous day: 04/07 0701 - 04/08 0700 In: 963 [P.O.:960; I.V.:3] Out: 0  Weight Filed Weights   10/12/15 0551 10/13/15 0607 10/14/15 0440  Weight: 78.881 kg (173 lb 14.4 oz) 76 kg (167 lb 8.8 oz) 77.248 kg (170 lb 4.8 oz)    Lab Results: Basic Metabolic Panel:  Recent Labs  10/13/15 0411 10/14/15 0432  NA 140 139  K 4.2 3.7  CL 101 102  CO2 25 25  GLUCOSE 98 108*  BUN 42* 40*  CREATININE 2.14* 1.95*     BNP    Component Value Date/Time   BNP 594.8* 10/10/2015 1535   Telemetry: Atrial fibrillation still with rapid ventricular response  Assessment/Plan:  1.  Atrial fibrillation with rapid ventricular response 2.  Acute on chronic diastolic heart failure clinically improved and asymptomatic 3.  Chronic anticoagulation  Recommendations:  Atrial fibrillation rate remains increased.  I would increase his atenolol to 50 mg twice a day.  Add low-dose Lanoxin to help with rate control.  May restart furosemide at low dose since renal function improved.      Kerry Hough  MD [facility] Cardiology  10/14/2015, 9:59 AM

## 2015-10-15 MED ORDER — ATENOLOL 50 MG PO TABS: 50.0000 mg | ORAL_TABLET | Freq: Two times a day (BID) | ORAL | Status: AC

## 2015-10-15 MED ORDER — POTASSIUM CHLORIDE CRYS ER 20 MEQ PO TBCR: 20.0000 meq | EXTENDED_RELEASE_TABLET | Freq: Every day | ORAL | Status: AC

## 2015-10-15 MED ORDER — RIVAROXABAN 15 MG PO TABS: 15.0000 mg | ORAL_TABLET | Freq: Every day | ORAL | Status: AC

## 2015-10-15 MED ORDER — FUROSEMIDE 40 MG PO TABS: 20.0000 mg | ORAL_TABLET | Freq: Every day | ORAL | Status: AC
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
## 2015-10-15 NOTE — Progress Notes (Signed)
Subjective:  No complaints of chest pain or shortness of breath.  Children in room and reviewed clinical course and diagnosis with him.  Objective:  Vital Signs in the last 24 hours: BP 124/79 mmHg  Pulse 76  Temp(Src) 98.5 F (36.9 C) (Oral)  Resp 18  Ht 5\' 9"  (1.753 m)  Wt 77.656 kg (171 lb 3.2 oz)  BMI 25.27 kg/m2  SpO2 98%  Physical Exam: Pleasant elderly black male in no acute distress Lungs:  Clear  Cardiac:  Irregularly irregular rhythm, normal S1 and S2, no S3 Abdomen:  Soft, nontender, no masses Extremities:  No edema present  Intake/Output from previous day: 04/08 0701 - 04/09 0700 In: 580 [P.O.:580] Out: 1250 [Urine:1250] Weight Filed Weights   10/13/15 0607 10/14/15 0440 10/15/15 0700  Weight: 76 kg (167 lb 8.8 oz) 77.248 kg (170 lb 4.8 oz) 77.656 kg (171 lb 3.2 oz)    Lab Results: Basic Metabolic Panel:  Recent Labs  10/13/15 0411 10/14/15 0432  NA 140 139  K 4.2 3.7  CL 101 102  CO2 25 25  GLUCOSE 98 108*  BUN 42* 40*  CREATININE 2.14* 1.95*     BNP    Component Value Date/Time   BNP 594.8* 10/10/2015 1535   Telemetry: Atrial fibrillation currently rate controlled   Assessment/Plan:  1.  Atrial fibrillation with Response controlled now  2.  Acute on chronic diastolic heart failure clinically improved and asymptomatic 3.  Chronic anticoagulation  Recommendations:  Okay to discharge home today.  I would send him home on atenolol 50 mg twice a day and not on Lanoxin.  He has a cardiologist in his home town and would advise cardiology follow-up in one week.  He should also weigh daily and it is okay to restart low-dose furosemide on discharge but he needs to have early cardiology follow-up.      Kerry Hough  MD [facility] Cardiology  10/15/2015, 9:38 AM

## 2015-10-15 NOTE — Discharge Summary (Signed)
Pt got discharged to home, discharge instructions provided and patient showed understanding to it, IV taken out,Telemonitor DC,pt left unit in wheelchair with all of the belongings accompanied with a family member (Son) 
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
## 2021-04-13 ENCOUNTER — Emergency Department (HOSPITAL_BASED_OUTPATIENT_CLINIC_OR_DEPARTMENT_OTHER): Payer: Medicare Other

## 2021-04-13 ENCOUNTER — Encounter (HOSPITAL_BASED_OUTPATIENT_CLINIC_OR_DEPARTMENT_OTHER): Payer: Self-pay

## 2021-04-13 ENCOUNTER — Emergency Department (HOSPITAL_BASED_OUTPATIENT_CLINIC_OR_DEPARTMENT_OTHER)
Admission: EM | Admit: 2021-04-13 | Discharge: 2021-04-13 | Disposition: A | Discharge status: Home / Self Care | Payer: Medicare Other | Attending: Emergency Medicine | Admitting: Emergency Medicine

## 2021-04-13 ENCOUNTER — Other Ambulatory Visit: Payer: Self-pay

## 2021-04-13 DIAGNOSIS — Z79899 Other long term (current) drug therapy: Secondary | ICD-10-CM | POA: Diagnosis not present

## 2021-04-13 DIAGNOSIS — R4182 Altered mental status, unspecified: Secondary | ICD-10-CM | POA: Insufficient documentation

## 2021-04-13 DIAGNOSIS — I4891 Unspecified atrial fibrillation: Secondary | ICD-10-CM | POA: Insufficient documentation

## 2021-04-13 DIAGNOSIS — N1832 Chronic kidney disease, stage 3b: Secondary | ICD-10-CM | POA: Insufficient documentation

## 2021-04-13 DIAGNOSIS — R4781 Slurred speech: Secondary | ICD-10-CM | POA: Diagnosis not present

## 2021-04-13 DIAGNOSIS — I130 Hypertensive heart and chronic kidney disease with heart failure and stage 1 through stage 4 chronic kidney disease, or unspecified chronic kidney disease: Secondary | ICD-10-CM | POA: Insufficient documentation

## 2021-04-13 DIAGNOSIS — R531 Weakness: Secondary | ICD-10-CM

## 2021-04-13 DIAGNOSIS — Z20822 Contact with and (suspected) exposure to covid-19: Secondary | ICD-10-CM | POA: Diagnosis not present

## 2021-04-13 DIAGNOSIS — I2510 Atherosclerotic heart disease of native coronary artery without angina pectoris: Secondary | ICD-10-CM | POA: Diagnosis not present

## 2021-04-13 DIAGNOSIS — Z7901 Long term (current) use of anticoagulants: Secondary | ICD-10-CM | POA: Diagnosis not present

## 2021-04-13 DIAGNOSIS — Y900 Blood alcohol level of less than 20 mg/100 ml: Secondary | ICD-10-CM | POA: Diagnosis not present

## 2021-04-13 DIAGNOSIS — R42 Dizziness and giddiness: Secondary | ICD-10-CM | POA: Insufficient documentation

## 2021-04-13 DIAGNOSIS — I5031 Acute diastolic (congestive) heart failure: Secondary | ICD-10-CM | POA: Diagnosis not present

## 2021-04-13 HISTORY — DX: Unspecified intestinal obstruction, unspecified as to partial versus complete obstruction: K56.609

## 2021-04-13 HISTORY — DX: Cerebral infarction, unspecified: I63.9

## 2021-04-13 HISTORY — DX: Sepsis, unspecified organism: A41.9

## 2021-04-13 LAB — URINALYSIS, ROUTINE W REFLEX MICROSCOPIC
Bilirubin Urine: NEGATIVE
Glucose, UA: NEGATIVE mg/dL
Hgb urine dipstick: NEGATIVE
Ketones, ur: NEGATIVE mg/dL
Leukocytes,Ua: NEGATIVE
Nitrite: NEGATIVE
Protein, ur: NEGATIVE mg/dL
Specific Gravity, Urine: 1.03 (ref 1.005–1.030)
pH: 5.5 (ref 5.0–8.0)

## 2021-04-13 LAB — APTT: aPTT: 28 seconds (ref 24–36)

## 2021-04-13 LAB — PROTIME-INR
INR: 1.2 (ref 0.8–1.2)
Prothrombin Time: 15.6 seconds — ABNORMAL HIGH (ref 11.4–15.2)

## 2021-04-13 LAB — CBC
HCT: 38.4 % — ABNORMAL LOW (ref 39.0–52.0)
Hemoglobin: 12.3 g/dL — ABNORMAL LOW (ref 13.0–17.0)
MCH: 32.2 pg (ref 26.0–34.0)
MCHC: 32 g/dL (ref 30.0–36.0)
MCV: 100.5 fL — ABNORMAL HIGH (ref 80.0–100.0)
Platelets: 129 10*3/uL — ABNORMAL LOW (ref 150–400)
RBC: 3.82 MIL/uL — ABNORMAL LOW (ref 4.22–5.81)
RDW: 14 % (ref 11.5–15.5)
WBC: 4.4 10*3/uL (ref 4.0–10.5)
nRBC: 0 % (ref 0.0–0.2)

## 2021-04-13 LAB — COMPREHENSIVE METABOLIC PANEL
ALT: 24 U/L (ref 0–44)
AST: 29 U/L (ref 15–41)
Albumin: 3.8 g/dL (ref 3.5–5.0)
Alkaline Phosphatase: 100 U/L (ref 38–126)
Anion gap: 5 (ref 5–15)
BUN: 25 mg/dL — ABNORMAL HIGH (ref 8–23)
CO2: 26 mmol/L (ref 22–32)
Calcium: 10.5 mg/dL — ABNORMAL HIGH (ref 8.9–10.3)
Chloride: 105 mmol/L (ref 98–111)
Creatinine, Ser: 1.7 mg/dL — ABNORMAL HIGH (ref 0.61–1.24)
GFR, Estimated: 38 mL/min — ABNORMAL LOW (ref >60)
Glucose, Bld: 107 mg/dL — ABNORMAL HIGH (ref 70–99)
Potassium: 4 mmol/L (ref 3.5–5.1)
Sodium: 136 mmol/L (ref 135–145)
Total Bilirubin: 0.3 mg/dL (ref 0.3–1.2)
Total Protein: 7.2 g/dL (ref 6.5–8.1)

## 2021-04-13 LAB — DIFFERENTIAL
Abs Immature Granulocytes: 0.01 10*3/uL (ref 0.00–0.07)
Basophils Absolute: 0 10*3/uL (ref 0.0–0.1)
Basophils Relative: 1 %
Eosinophils Absolute: 0.2 10*3/uL (ref 0.0–0.5)
Eosinophils Relative: 4 %
Immature Granulocytes: 0 %
Lymphocytes Relative: 32 %
Lymphs Abs: 1.4 10*3/uL (ref 0.7–4.0)
Monocytes Absolute: 0.5 10*3/uL (ref 0.1–1.0)
Monocytes Relative: 10 %
Neutro Abs: 2.3 10*3/uL (ref 1.7–7.7)
Neutrophils Relative %: 53 %

## 2021-04-13 LAB — ETHANOL: Alcohol, Ethyl (B): <10 mg/dL (ref <10)

## 2021-04-13 LAB — TROPONIN I (HIGH SENSITIVITY)
Troponin I (High Sensitivity): 6 ng/L (ref <18)
Troponin I (High Sensitivity): 5 ng/L (ref <18)

## 2021-04-13 LAB — RAPID URINE DRUG SCREEN, HOSP PERFORMED
Amphetamines: NOT DETECTED
Barbiturates: NOT DETECTED
Benzodiazepines: NOT DETECTED
Cocaine: NOT DETECTED
Opiates: NOT DETECTED
Tetrahydrocannabinol: NOT DETECTED

## 2021-04-13 LAB — RESP PANEL BY RT-PCR (FLU A&B, COVID) ARPGX2
Influenza A by PCR: NEGATIVE
Influenza B by PCR: NEGATIVE
SARS Coronavirus 2 by RT PCR: NEGATIVE

## 2021-04-13 LAB — CBG MONITORING, ED: Glucose-Capillary: 100 mg/dL — ABNORMAL HIGH (ref 70–99)

## 2021-04-13 NOTE — Discharge Instructions (Addendum)
All the lab work and imaging looks okay today.  Spoke with our neurologist here who thought he may benefit from having an EEG to make sure that these are not seizures that he is having.  Also she recommended that he be on Eliquis 2.5 mg 2 times a day and talking with your neurologist because 5 mg she reported would be too much for him and too much risk of bleeding.  If his symptoms return or a he is having more difficulty please return to the emergency room.

## 2021-04-13 NOTE — ED Notes (Signed)
Pt stood at bedside with son's assistance. Pt at baseline per son. BP unchanged while standing

## 2021-04-13 NOTE — ED Notes (Signed)
Patient transported to CT 

## 2021-04-13 NOTE — ED Triage Notes (Addendum)
Pt assisted from car to w/c-taken to tx room via w/c-pt able to stand/shuffle/pivot to stretcher with assist-wife and son state pt with changes in speech and gait started ~130pm-EDP [place] in room-son at BS-pt alert to name and age
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
## 2022-11-06 DEATH — deceased
